# Patient Record
Sex: Male | Born: 1990
Health system: Southern US, Community
[De-identification: ages and names within clinical notes are randomized; demographics above are authoritative.]

---
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
## 2015-04-06 ENCOUNTER — Emergency Department (HOSPITAL_COMMUNITY): Payer: Managed Care, Other (non HMO)

## 2015-04-06 ENCOUNTER — Encounter (HOSPITAL_COMMUNITY): Payer: Self-pay | Admitting: Emergency Medicine

## 2015-04-06 ENCOUNTER — Emergency Department (HOSPITAL_COMMUNITY)
Admission: EM | Admit: 2015-04-06 | Discharge: 2015-04-06 | Disposition: A | Discharge status: Home / Self Care | Payer: Managed Care, Other (non HMO) | Attending: Emergency Medicine | Admitting: Emergency Medicine

## 2015-04-06 DIAGNOSIS — S30811A Abrasion of abdominal wall, initial encounter: Secondary | ICD-10-CM | POA: Diagnosis not present

## 2015-04-06 DIAGNOSIS — Y9289 Other specified places as the place of occurrence of the external cause: Secondary | ICD-10-CM | POA: Insufficient documentation

## 2015-04-06 DIAGNOSIS — W11XXXA Fall on and from ladder, initial encounter: Secondary | ICD-10-CM | POA: Insufficient documentation

## 2015-04-06 DIAGNOSIS — Y998 Other external cause status: Secondary | ICD-10-CM | POA: Insufficient documentation

## 2015-04-06 DIAGNOSIS — S2232XA Fracture of one rib, left side, initial encounter for closed fracture: Secondary | ICD-10-CM

## 2015-04-06 DIAGNOSIS — S299XXA Unspecified injury of thorax, initial encounter: Secondary | ICD-10-CM | POA: Diagnosis present

## 2015-04-06 DIAGNOSIS — Y9389 Activity, other specified: Secondary | ICD-10-CM | POA: Diagnosis not present

## 2015-04-06 DIAGNOSIS — W19XXXA Unspecified fall, initial encounter: Secondary | ICD-10-CM

## 2015-04-06 DIAGNOSIS — S2242XA Multiple fractures of ribs, left side, initial encounter for closed fracture: Secondary | ICD-10-CM | POA: Insufficient documentation

## 2015-04-06 LAB — I-STAT CHEM 8, ED
BUN: 15 mg/dL (ref 6–20)
CHLORIDE: 103 mmol/L (ref 101–111)
CREATININE: 1 mg/dL (ref 0.61–1.24)
Calcium, Ion: 1.21 mmol/L (ref 1.12–1.23)
GLUCOSE: 122 mg/dL — AB (ref 65–99)
HEMATOCRIT: 51 % (ref 39.0–52.0)
HEMOGLOBIN: 17.3 g/dL — AB (ref 13.0–17.0)
POTASSIUM: 3.7 mmol/L (ref 3.5–5.1)
Sodium: 142 mmol/L (ref 135–145)
TCO2: 26 mmol/L (ref 0–100)

## 2015-04-06 MED ORDER — HYDROCODONE-ACETAMINOPHEN 5-325 MG PO TABS: 1.0000 | ORAL_TABLET | Freq: Four times a day (QID) | ORAL | Status: DC | PRN

## 2015-04-06 MED ORDER — IOHEXOL 300 MG/ML  SOLN
100.0000 mL | Freq: Once | INTRAMUSCULAR | Status: AC | PRN
  Administered 2015-04-06: 100 mL via INTRAVENOUS

## 2015-04-06 NOTE — ED Notes (Signed)
Patient transported to X-ray 

## 2015-04-06 NOTE — ED Provider Notes (Signed)
CSN: [id number]     Arrival date & time 04/06/15  [id number] History   First MD Initiated Contact with Patient 04/06/15 [phone]     Chief Complaint  Patient presents with  . Fall  . Rib Injury     (Consider location/radiation/quality/duration/timing/severity/associated sxs/prior Treatment) HPI Comments: Patient is an otherwise healthy 24 year old male who presents after a fall from a ladder. He states he was helping cut down a tree when the latter was knocked over and he fell approximately 8 feet. He landed on his left flank on one of the rungs of the ladder. He is complaining of pain in his left flank and left ribs. It is worse with movement and deep breathing. He denies shortness of breath. He denies other injury. There is no loss of consciousness. He denies headache, neck pain, or extremity injury/pain.  Patient is a 24 y.o. male presenting with fall. The history is provided by the patient.  Fall This is a new problem. The current episode started less than 1 hour ago. The problem occurs constantly. The problem has not changed since onset.Associated symptoms include chest pain and abdominal pain. Pertinent negatives include no shortness of breath. Nothing aggravates the symptoms. Nothing relieves the symptoms. He has tried nothing for the symptoms. The treatment provided no relief.    History reviewed. No pertinent past medical history. History reviewed. No pertinent past surgical history. No family history on file. Social History  Substance Use Topics  . Smoking status: Never Smoker   . Smokeless tobacco: None  . Alcohol Use: No    Review of Systems  Respiratory: Negative for shortness of breath.   Cardiovascular: Positive for chest pain.  Gastrointestinal: Positive for abdominal pain.  All other systems reviewed and are negative.     Allergies  Review of patient's allergies indicates not on file.  Home Medications   Prior to Admission medications   Not on File   BP 135/81 mmHg   Pulse 98  Temp(Src) 97.9 F (36.6 C) (Oral)  Resp 16  Ht 6\' 1"  (1.854 m)  Wt 140 lb (63.504 kg)  BMI 18.47 kg/m2  SpO2 99% Physical Exam  Constitutional: He is oriented to person, place, and time. He appears well-developed and well-nourished. No distress.  HENT:  Head: Normocephalic and atraumatic.  Neck: Normal range of motion. Neck supple.  Cardiovascular: Normal rate, regular rhythm and normal heart sounds.   No murmur heard. Pulmonary/Chest: Effort normal and breath sounds normal. No respiratory distress. He has no wheezes. He exhibits tenderness.  There are abrasions to the left flank and left lateral abdomen. Breath sounds are clear and equal. There is no crepitus or deformity palpable.  Abdominal: Soft. Bowel sounds are normal. He exhibits no distension. There is tenderness.  There is tenderness to palpation in the left upper quadrant and left flank.  Musculoskeletal: Normal range of motion. He exhibits no edema.  Neurological: He is alert and oriented to person, place, and time.  Skin: Skin is warm and dry. He is not diaphoretic.  Nursing note and vitals reviewed.   ED Course  Procedures (including critical care time) Labs Review Labs Reviewed - No data to display  Imaging Review No results found. I have personally reviewed and evaluated these images and lab results as part of my medical decision-making.   EKG Interpretation None      MDM   Final diagnoses:  None    Patient presents here after an 8 foot fall from ladder. He landed on  the rung of the ladder and fractured 3 ribs. He has no pneumothorax and he appears comfortable. CT scan reveals no evidence for kidney or spleen injury or other intra-abdominal injury. He will be treated with pain medication, discharged, and is to return as needed if worsening.    Geoffery Lyons, MD 04/06/15 1236

## 2015-04-06 NOTE — ED Notes (Signed)
Pt. Stated, I was on a ladder cutting a tree and I fell about 8 ft on top of ladder. Pain on the left side.

## 2015-04-06 NOTE — Discharge Instructions (Signed)
Ibuprofen 600 mg every 6 hours as needed for pain.  Hydrocodone as prescribed as needed for pain not relieved with ibuprofen.  Return to the emergency department if you develop fever with productive cough, or other new and concerning symptoms.   Rib Fracture A rib fracture is a break or crack in one of the bones of the ribs. The ribs are a group of long, curved bones that wrap around your chest and attach to your spine. They protect your lungs and other organs in the chest cavity. A broken or cracked rib is often painful, but most do not cause other problems. Most rib fractures heal on their own over time. However, rib fractures can be more serious if multiple ribs are broken or if broken ribs move out of place and push against other structures. CAUSES   A direct blow to the chest. For example, this could happen during contact sports, a car accident, or a fall against a hard object.  Repetitive movements with high force, such as pitching a baseball or having severe coughing spells. SYMPTOMS   Pain when you breathe in or cough.  Pain when someone presses on the injured area. DIAGNOSIS  Your caregiver will perform a physical exam. Various imaging tests may be ordered to confirm the diagnosis and to look for related injuries. These tests may include a chest X-ray, computed tomography (CT), magnetic resonance imaging (MRI), or a bone scan. TREATMENT  Rib fractures usually heal on their own in 1-3 months. The longer healing period is often associated with a continued cough or other aggravating activities. During the healing period, pain control is very important. Medication is usually given to control pain. Hospitalization or surgery may be needed for more severe injuries, such as those in which multiple ribs are broken or the ribs have moved out of place.  HOME CARE INSTRUCTIONS   Avoid strenuous activity and any activities or movements that cause pain. Be careful during activities and avoid  bumping the injured rib.  Gradually increase activity as directed by your caregiver.  Only take over-the-counter or prescription medications as directed by your caregiver. Do not take other medications without asking your caregiver first.  Apply ice to the injured area for the first 1-2 days after you have been treated or as directed by your caregiver. Applying ice helps to reduce inflammation and pain.  Put ice in a plastic bag.  Place a towel between your skin and the bag.   Leave the ice on for 15-20 minutes at a time, every 2 hours while you are awake.  Perform deep breathing as directed by your caregiver. This will help prevent pneumonia, which is a common complication of a broken rib. Your caregiver may instruct you to:  Take deep breaths several times a day.  Try to cough several times a day, holding a pillow against the injured area.  Use a device called an incentive spirometer to practice deep breathing several times a day.  Drink enough fluids to keep your urine clear or pale yellow. This will help you avoid constipation.   Do not wear a rib belt or binder. These restrict breathing, which can lead to pneumonia.  SEEK IMMEDIATE MEDICAL CARE IF:   You have a fever.   You have difficulty breathing or shortness of breath.   You develop a continual cough, or you cough up thick or bloody sputum.  You feel sick to your stomach (nausea), throw up (vomit), or have abdominal pain.   You  have worsening pain not controlled with medications.  MAKE SURE YOU:  Understand these instructions.  Will watch your condition.  Will get help right away if you are not doing well or get worse.   This information is not intended to replace advice given to you by your health care provider. Make sure you discuss any questions you have with your health care provider.   Document Released: 04/20/2005 Document Revised: 12/21/2012 Document Reviewed: 06/22/2012 Elsevier Interactive  Patient Education [employer]

## 2015-08-25 ENCOUNTER — Ambulatory Visit (INDEPENDENT_AMBULATORY_CARE_PROVIDER_SITE_OTHER): Payer: BLUE CROSS/BLUE SHIELD

## 2015-08-25 ENCOUNTER — Encounter (HOSPITAL_COMMUNITY): Payer: Self-pay

## 2015-08-25 ENCOUNTER — Ambulatory Visit (HOSPITAL_COMMUNITY)
Admission: EM | Admit: 2015-08-25 | Discharge: 2015-08-25 | Disposition: A | Discharge status: Home / Self Care | Payer: BLUE CROSS/BLUE SHIELD | Attending: Emergency Medicine | Admitting: Emergency Medicine

## 2015-08-25 DIAGNOSIS — S61319A Laceration without foreign body of unspecified finger with damage to nail, initial encounter: Secondary | ICD-10-CM

## 2015-08-25 DIAGNOSIS — Z23 Encounter for immunization: Secondary | ICD-10-CM

## 2015-08-25 DIAGNOSIS — S61212A Laceration without foreign body of right middle finger without damage to nail, initial encounter: Secondary | ICD-10-CM | POA: Diagnosis not present

## 2015-08-25 MED ORDER — TETANUS-DIPHTH-ACELL PERTUSSIS 5-2.5-18.5 LF-MCG/0.5 IM SUSP
0.5000 mL | Freq: Once | INTRAMUSCULAR | Status: AC
  Administered 2015-08-25: 0.5 mL via INTRAMUSCULAR

## 2015-08-25 MED ORDER — IBUPROFEN 800 MG PO TABS: 800.0000 mg | ORAL_TABLET | Freq: Three times a day (TID) | ORAL | Status: AC | PRN

## 2015-08-25 MED ORDER — IBUPROFEN 800 MG PO TABS
800.0000 mg | ORAL_TABLET | Freq: Once | ORAL | Status: AC
  Administered 2015-08-25: 800 mg via ORAL

## 2015-08-25 MED ORDER — IBUPROFEN 800 MG PO TABS
ORAL_TABLET | ORAL | Status: AC
  Filled 2015-08-25: qty 1

## 2015-08-25 MED ORDER — TRAMADOL HCL 50 MG PO TABS: ORAL_TABLET | ORAL | Status: AC

## 2015-08-25 MED ORDER — TETANUS-DIPHTH-ACELL PERTUSSIS 5-2.5-18.5 LF-MCG/0.5 IM SUSP
INTRAMUSCULAR | Status: AC
  Filled 2015-08-25: qty 0.5

## 2015-08-25 NOTE — ED Notes (Signed)
Patient present with cut to middle finger on rt hand, he states he was cutting a piece of wood and the blade caught his finger, incident took place on tonight. No acute distress

## 2015-08-25 NOTE — ED Provider Notes (Signed)
HPI  SUBJECTIVE:  Derrick Estrada is a left-handed 25 y.o. male who presents with a nail injury to his right ring finger. tates that he sustained a laceration with a table saw while trying to cut some wood. He tried applying pressure with hemostasis and came directly here. There are no other aggravating or alleviating factors. He denies foreign body sensation, limitation of motion, other hand or finger injury. Tetanus is unknown. Past medical history negative for diabetes, hypertension. He is not a smoker. PMD: Dr. Creola CornJohn Russo.    History reviewed. No pertinent past medical history.  History reviewed. No pertinent past surgical history.  No family history on file.  Social History  Substance Use Topics  . Smoking status: Never Smoker   . Smokeless tobacco: Never Used  . Alcohol Use: No    No current facility-administered medications for this encounter.  Current outpatient prescriptions:  .  ibuprofen (ADVIL,MOTRIN) 800 MG tablet, Take 1 tablet (800 mg total) by mouth every 8 (eight) hours as needed for fever., Disp: 30 tablet, Rfl: 0 .  traMADol (ULTRAM) 50 MG tablet, 1-2 tabs po q 6 hr prn pain Maximum dose= 8 tablets per day, Disp: 20 tablet, Rfl: 0  No Known Allergies   ROS  As noted in HPI.   Physical Exam  BP 125/77 mmHg  Pulse 82  Temp(Src) 97.9 F (36.6 C) (Oral)  Resp 18  SpO2 99%  Constitutional: Well developed, well nourished, no acute distress Eyes:  EOMI, conjunctiva normal bilaterally HENT: Normocephalic, atraumatic,mucus membranes moist Respiratory: Normal inspiratory effort Cardiovascular: Normal rate GI: nondistended skin: No rash, skin intact Musculoskeletal: Right ring finger missing nail. No foreign body noted. Patient able to flex/extend at the DIP and PIP against resistance. 2 point Discrimination intact. No other injury to the hand. See picture.     Neurologic: Alert & oriented x 3, no focal neuro deficits Psychiatric: Speech and behavior  appropriate   ED Course   Medications  Tdap (BOOSTRIX) injection 0.5 mL (0.5 mLs Intramuscular Given 08/25/15 2000)  ibuprofen (ADVIL,MOTRIN) tablet 800 mg (800 mg Oral Given 08/25/15 2000)    Orders Placed This Encounter  Procedures  . DG Finger Ring Right    Standing Status: Standing     Number of Occurrences: 1     Standing Expiration Date:     Order Specific Question:  Reason for Exam (SYMPTOM  OR DIAGNOSIS REQUIRED)    Answer:  nail injury- missing nail-  r/o underlying fx     Comments:  missing nail- r/o underlying fx    No results found for this or any previous visit (from the past 24 hour(s)). Dg Finger Ring Right  08/25/2015  CLINICAL DATA:  Cut middle finger with saw today. Active bleeding. Evaluate for osseous injury. EXAM: RIGHT MIDDLE FINGER 2+V COMPARISON:  None. FINDINGS: Soft tissue laceration of the distal tuft noted without evidence of associated radiopaque foreign body, acute fracture or dislocation. IMPRESSION: Soft tissue laceration of the distal long finger. No evidence of foreign body or acute fracture. Electronically Signed   By: Carey BullocksWilliam  Veazey M.D.   On: 08/25/2015 19:50   ED Clinical Impression  Nailbed laceration, finger, initial encounter   ED Assessment/Plan   reviewed imaging independently. No foreign body or tuft fracture.See radiology report for full details.  updating tetanus. Giving ibuprofen 800 mg by mouth. Had patient irrigate the wound, dressed it with Xeroform, nonstick dressing. We'll send home with ibuprofen, tramadol. Patient is otherwise healthy,has no apparent contamination of  the wound. Patient does not require empiric antibiotics per up-to-date.  Will have patient return here on Wednesday for recheck. Return to the ER for any signs of infection.  Discussed  imaging, MDM, plan and followup with patient. Discussed sn/sx that should prompt return to the UC or ED. Patient  agrees with plan.   *This clinic note was created using Dragon  dictation software. Therefore, there may be occasional mistakes despite careful proofreading.  ?   Domenick Gong, MD 08/25/15 2125

## 2015-08-25 NOTE — Discharge Instructions (Signed)
Keep the dressing intact for 48 hours. Return here on Wednesday for reevaluation and a dressing change. I will be here after 5 PM . Or, you can follow up with your primary care physician. Go to the ER for any signs of infection, fever, or other concerns.

## 2015-08-27 ENCOUNTER — Ambulatory Visit (INDEPENDENT_AMBULATORY_CARE_PROVIDER_SITE_OTHER): Payer: 59 | Admitting: Adult Health

## 2015-08-27 ENCOUNTER — Encounter: Payer: Self-pay | Admitting: Adult Health

## 2015-08-27 VITALS — BP 122/70 | HR 91 | Temp 97.8°F | Wt 150.3 lb

## 2015-08-27 DIAGNOSIS — S6991XD Unspecified injury of right wrist, hand and finger(s), subsequent encounter: Secondary | ICD-10-CM | POA: Diagnosis not present

## 2015-08-27 MED ORDER — OXYCODONE-ACETAMINOPHEN 10-325 MG PO TABS: 1.0000 | ORAL_TABLET | Freq: Three times a day (TID) | ORAL | Status: AC | PRN

## 2015-08-27 MED ORDER — CEPHALEXIN 500 MG PO CAPS: 500.0000 mg | ORAL_CAPSULE | Freq: Two times a day (BID) | ORAL | Status: AC

## 2015-08-27 NOTE — Progress Notes (Signed)
   Subjective:    Patient ID: Derrick Estrada, male    DOB: Aug 28, 1990, 25 y.o.   MRN: [id number]  HPI  25 year old male who presents to the office today for injury he sustained to his right ring finger.he  states that he sustained a laceration to the finger nail with a table saw while trying to cut some wood. He tried applying pressure with hemostasis and [facility]. He had a xray of his hand done which was negative for tuft fracture or foreign body.   Today he reports that he has increased pain and stiffness in his IP joint on the right ring finger. He was prescribed Ibuprofen and Tramadol in the ER. The tramadol is not controlling his pain adequately. He denies any symptoms of infection.   Review of Systems  Constitutional: Negative.   Musculoskeletal: Negative for joint swelling.  Skin: Positive for wound.  All other systems reviewed and are negative.      Objective:   Physical Exam  Constitutional: He is oriented to person, place, and time. He appears well-developed and well-nourished. No distress.  Musculoskeletal:  Has pain with palpation to IP joint of right ring finger. Also with perceived stiffness of that joint. No signs of infection noted.   Neurological: He is alert and oriented to person, place, and time.  Skin: Skin is warm and dry. No rash noted. He is not diaphoretic. No erythema. No pallor.  Psychiatric: He has a normal mood and affect. His behavior is normal. Judgment and thought content normal.  Nursing note and vitals reviewed.     Assessment & Plan:  1. Finger injury, right, subsequent encounter - Will cover for infection due to mechanism of injury and location to joint.  - cephALEXin (KEFLEX) 500 MG capsule; Take 1 capsule (500 mg total) by mouth 2 (two) times daily.  Dispense: 20 capsule; Refill: 0 - oxyCODONE-acetaminophen (PERCOCET) 10-325 MG tablet; Take 1 tablet by mouth every 8 (eight) hours as needed for pain.  Dispense: 30  tablet; Refill: 0 - Ibuprofen 600mg  Q8H for pain. Percocet for breakthrough pain.  - Follow up with any signs of infection.   Shirline Freesory Mete Purdum, NP

## 2015-08-27 NOTE — Patient Instructions (Addendum)
It was great meeting you today!  I have sent in a prescription for Keflex(antibiotics), take this twice a day for 10 days.   You also have a prescription for Percocet, take this every 8 hours for breakthrough pain. Do not take while driving or operating a table saw.   Change the bandage daily. It is ok to take it off before the shower and clean it with soap and water in the shower.   Let me know if you notice any signs of infection.   Let me know if you need anything.

## 2015-08-30 NOTE — Addendum Note (Signed)
Addended by: Nancy FetterNAFZIGER, Deeann Servidio L on: 08/30/2015 05:17 PM   Modules accepted: Level of Service

## 2017-12-19 IMAGING — DX DG FINGER RING 2+V*R*
3 series · 3 of 3 positions shown · non-contrast
Comparison: None.

CLINICAL DATA: Cut middle finger with saw today. Active bleeding.
Evaluate for osseous injury.

EXAM:
RIGHT MIDDLE FINGER 2+V

[finger ap]
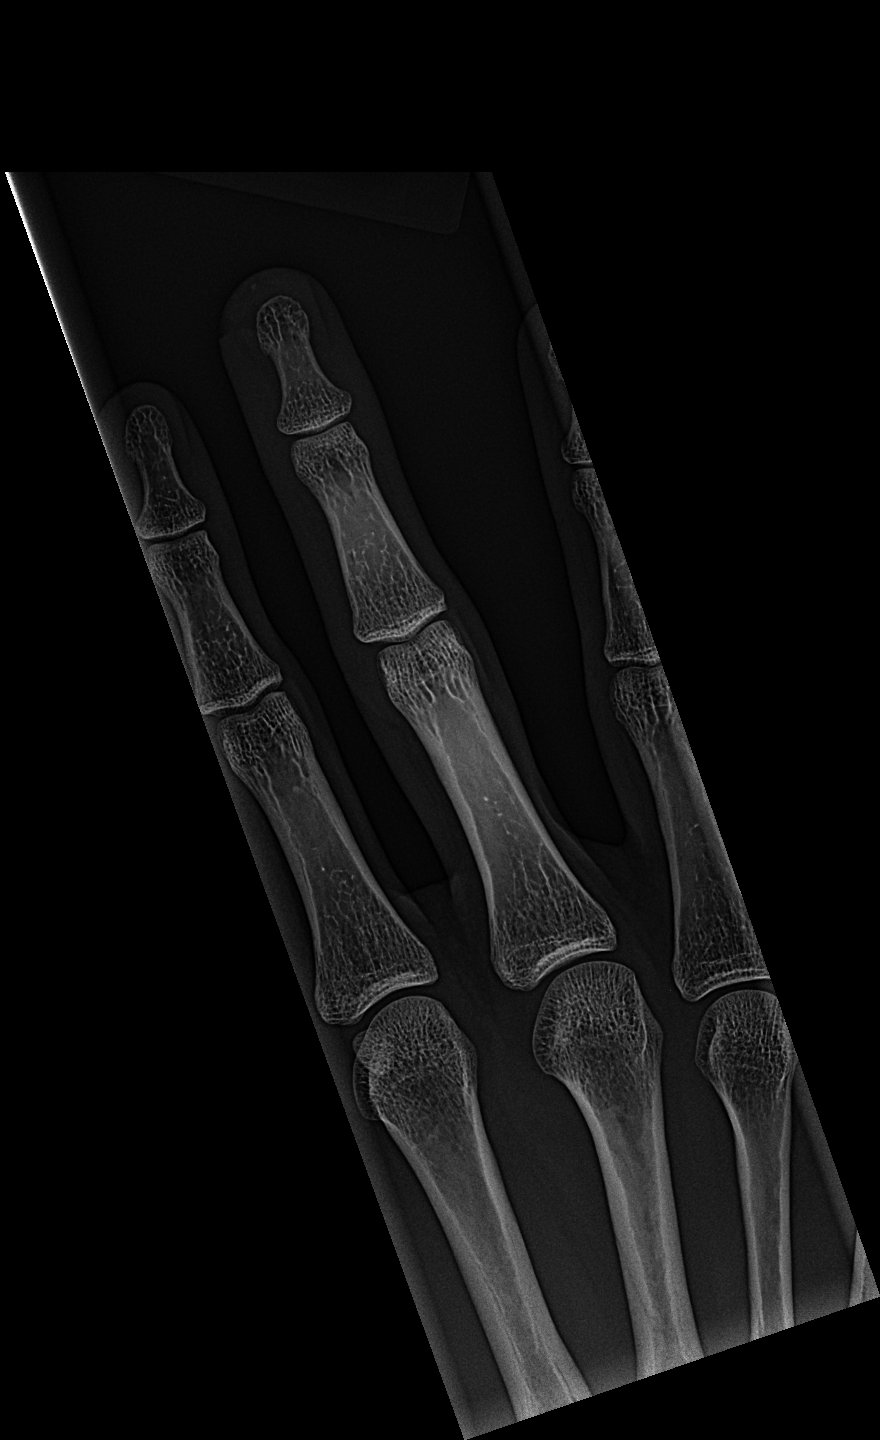

[finger obl]
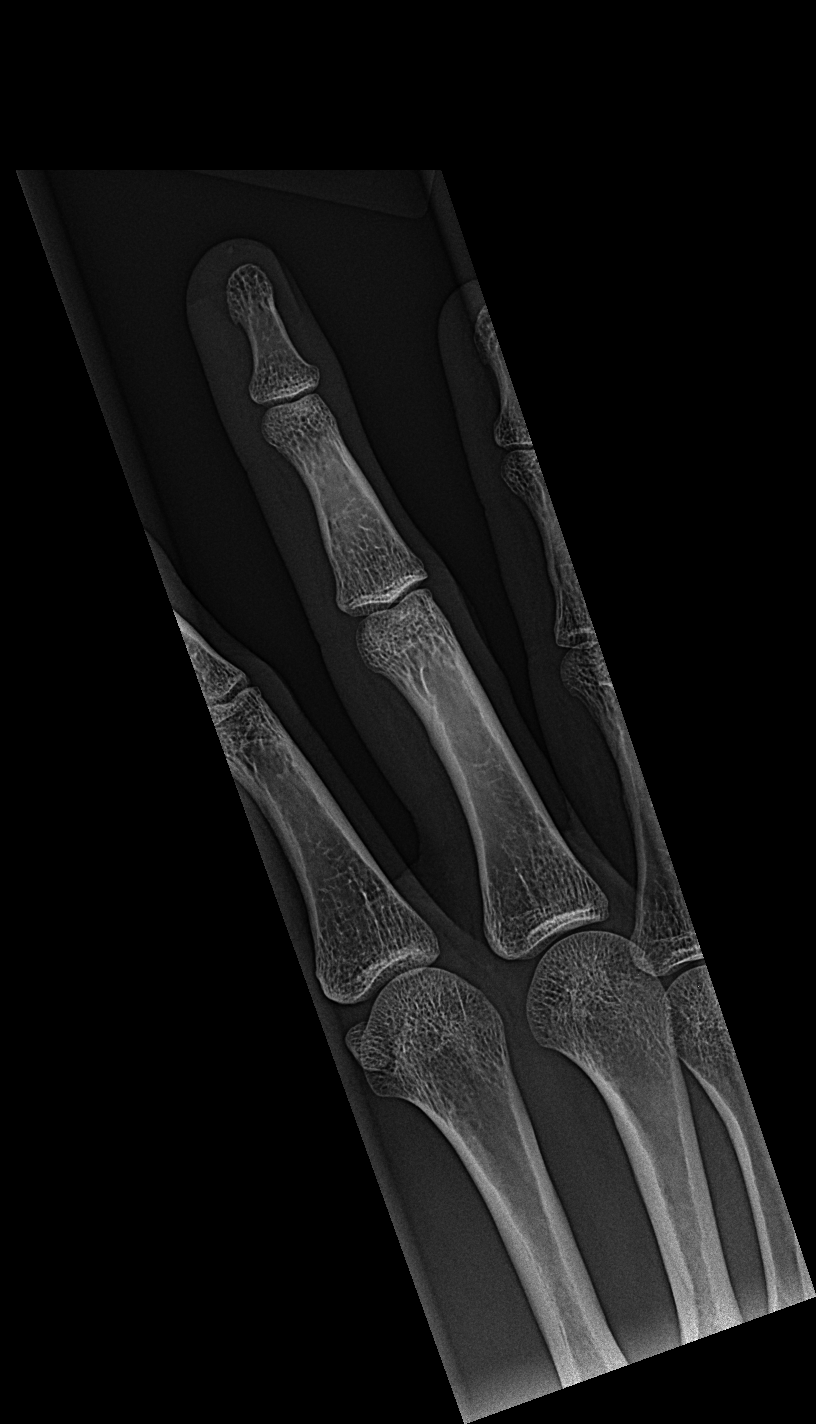

[finger lat]
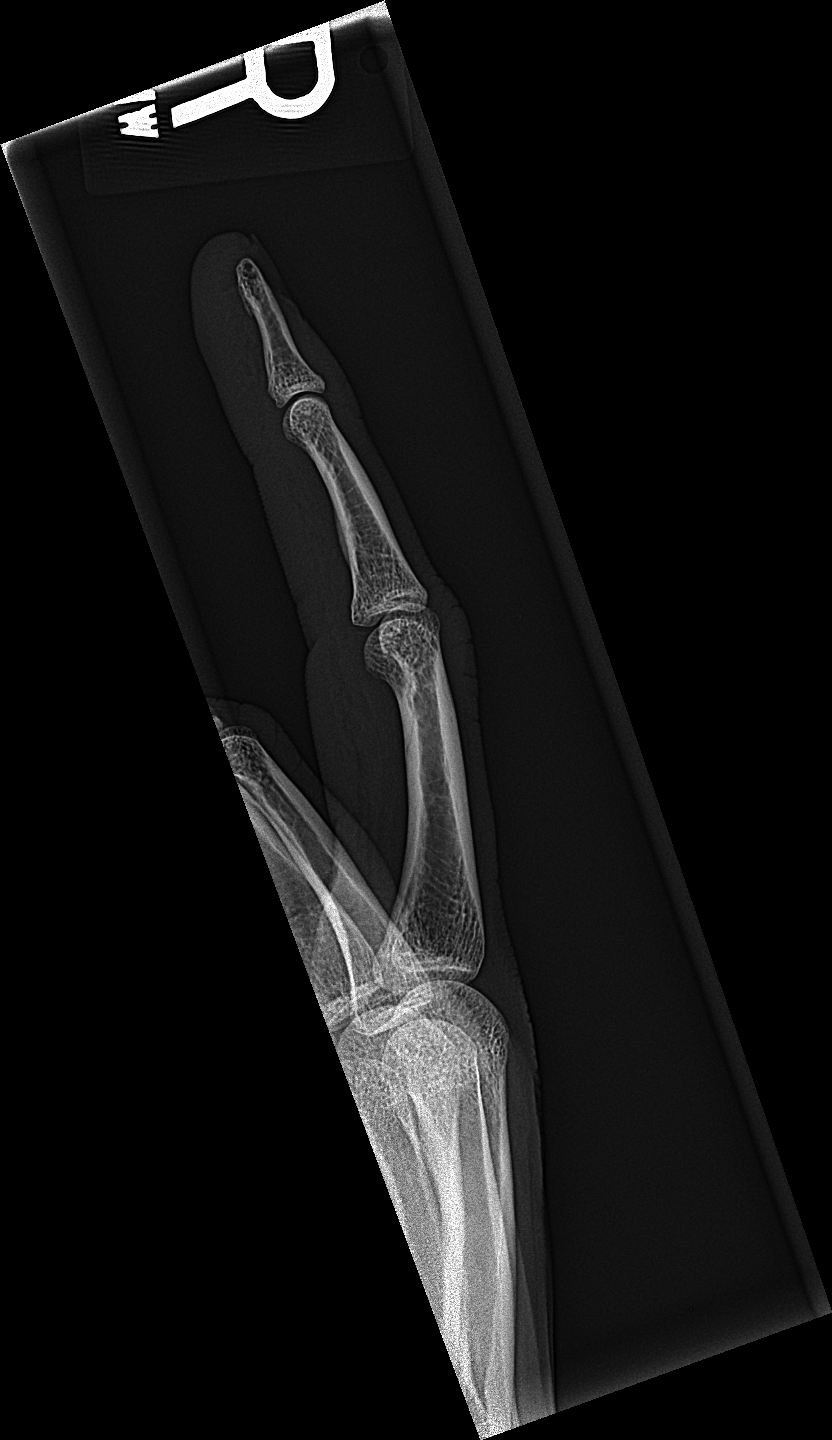

[3 of 3 positions shown; findings below may reference images not displayed]

FINDINGS: Soft tissue laceration of the distal tuft noted without evidence of
associated radiopaque foreign body, acute fracture or dislocation.
IMPRESSION: Soft tissue laceration of the distal long finger. No evidence of
foreign body or acute fracture.

## 2022-05-06 ENCOUNTER — Other Ambulatory Visit: Payer: Self-pay | Admitting: Internal Medicine

## 2022-05-06 DIAGNOSIS — N5089 Other specified disorders of the male genital organs: Secondary | ICD-10-CM

## 2022-05-25 ENCOUNTER — Ambulatory Visit
Admission: RE | Admit: 2022-05-25 | Discharge: 2022-05-25 | Disposition: A | Discharge status: Home / Self Care | Payer: Commercial Managed Care - PPO | Source: Ambulatory Visit | Attending: Internal Medicine | Admitting: Internal Medicine

## 2022-05-25 DIAGNOSIS — I861 Scrotal varices: Secondary | ICD-10-CM | POA: Diagnosis not present

## 2022-05-25 DIAGNOSIS — N5089 Other specified disorders of the male genital organs: Secondary | ICD-10-CM

## 2022-05-25 DIAGNOSIS — N503 Cyst of epididymis: Secondary | ICD-10-CM | POA: Diagnosis not present

## 2022-12-02 DIAGNOSIS — Z0000 Encounter for general adult medical examination without abnormal findings: Secondary | ICD-10-CM | POA: Diagnosis not present

## 2022-12-02 DIAGNOSIS — N5089 Other specified disorders of the male genital organs: Secondary | ICD-10-CM | POA: Diagnosis not present

## 2022-12-02 DIAGNOSIS — Z0001 Encounter for general adult medical examination with abnormal findings: Secondary | ICD-10-CM | POA: Diagnosis not present

## 2022-12-08 DIAGNOSIS — Z0000 Encounter for general adult medical examination without abnormal findings: Secondary | ICD-10-CM | POA: Diagnosis not present

## 2022-12-08 DIAGNOSIS — Z1331 Encounter for screening for depression: Secondary | ICD-10-CM | POA: Diagnosis not present

## 2022-12-08 DIAGNOSIS — Z1389 Encounter for screening for other disorder: Secondary | ICD-10-CM | POA: Diagnosis not present

## 2022-12-08 DIAGNOSIS — R82998 Other abnormal findings in urine: Secondary | ICD-10-CM | POA: Diagnosis not present

## 2023-12-08 DIAGNOSIS — Z0000 Encounter for general adult medical examination without abnormal findings: Secondary | ICD-10-CM | POA: Diagnosis not present

## 2023-12-13 DIAGNOSIS — Z1331 Encounter for screening for depression: Secondary | ICD-10-CM | POA: Diagnosis not present

## 2023-12-13 DIAGNOSIS — R82998 Other abnormal findings in urine: Secondary | ICD-10-CM | POA: Diagnosis not present

## 2023-12-13 DIAGNOSIS — Z0000 Encounter for general adult medical examination without abnormal findings: Secondary | ICD-10-CM | POA: Diagnosis not present
